# Patient Record
Sex: Female | Born: 1990
Health system: Southern US, Community
[De-identification: ages and names within clinical notes are randomized; demographics above are authoritative.]

## PROBLEM LIST (undated history)

## (undated) HISTORY — PX: KNEE SURGERY: SHX244

---
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
## 2013-11-23 ENCOUNTER — Ambulatory Visit: Payer: BC Managed Care – PPO | Attending: Orthopaedic Surgery | Admitting: Physical Therapy

## 2013-11-23 DIAGNOSIS — M25669 Stiffness of unspecified knee, not elsewhere classified: Secondary | ICD-10-CM | POA: Insufficient documentation

## 2013-11-23 DIAGNOSIS — J45909 Unspecified asthma, uncomplicated: Secondary | ICD-10-CM | POA: Insufficient documentation

## 2013-11-23 DIAGNOSIS — M25569 Pain in unspecified knee: Secondary | ICD-10-CM | POA: Insufficient documentation

## 2013-11-23 DIAGNOSIS — R5381 Other malaise: Secondary | ICD-10-CM | POA: Insufficient documentation

## 2013-11-23 DIAGNOSIS — IMO0001 Reserved for inherently not codable concepts without codable children: Secondary | ICD-10-CM | POA: Insufficient documentation

## 2013-11-25 ENCOUNTER — Ambulatory Visit: Payer: BC Managed Care – PPO | Admitting: Physical Therapy

## 2013-11-27 ENCOUNTER — Ambulatory Visit: Payer: BC Managed Care – PPO | Admitting: Physical Therapy

## 2013-11-30 ENCOUNTER — Ambulatory Visit: Payer: BC Managed Care – PPO | Admitting: Physical Therapy

## 2013-12-02 ENCOUNTER — Ambulatory Visit: Payer: BC Managed Care – PPO | Admitting: Physical Therapy

## 2013-12-07 ENCOUNTER — Encounter: Payer: PRIVATE HEALTH INSURANCE | Admitting: Physical Therapy

## 2013-12-09 ENCOUNTER — Ambulatory Visit: Payer: BC Managed Care – PPO | Admitting: Physical Therapy

## 2013-12-15 ENCOUNTER — Ambulatory Visit: Payer: BC Managed Care – PPO | Attending: Orthopaedic Surgery | Admitting: *Deleted

## 2013-12-15 DIAGNOSIS — R5381 Other malaise: Secondary | ICD-10-CM | POA: Insufficient documentation

## 2013-12-15 DIAGNOSIS — M25669 Stiffness of unspecified knee, not elsewhere classified: Secondary | ICD-10-CM | POA: Insufficient documentation

## 2013-12-15 DIAGNOSIS — IMO0001 Reserved for inherently not codable concepts without codable children: Secondary | ICD-10-CM | POA: Insufficient documentation

## 2013-12-15 DIAGNOSIS — M25569 Pain in unspecified knee: Secondary | ICD-10-CM | POA: Insufficient documentation

## 2013-12-16 ENCOUNTER — Encounter: Payer: PRIVATE HEALTH INSURANCE | Admitting: Physical Therapy

## 2013-12-17 ENCOUNTER — Ambulatory Visit: Payer: BC Managed Care – PPO | Admitting: Physical Therapy

## 2013-12-22 ENCOUNTER — Encounter: Payer: PRIVATE HEALTH INSURANCE | Admitting: *Deleted

## 2014-08-25 ENCOUNTER — Telehealth: Payer: Self-pay

## 2014-10-21 NOTE — Telephone Encounter (Signed)
A user error has taken place: encounter opened in error, closed for administrative reasons.

## 2016-11-16 ENCOUNTER — Ambulatory Visit (INDEPENDENT_AMBULATORY_CARE_PROVIDER_SITE_OTHER): Payer: BLUE CROSS/BLUE SHIELD | Admitting: Physician Assistant

## 2016-11-16 ENCOUNTER — Encounter: Payer: Self-pay | Admitting: Physician Assistant

## 2016-11-16 ENCOUNTER — Encounter (INDEPENDENT_AMBULATORY_CARE_PROVIDER_SITE_OTHER): Payer: Self-pay

## 2016-11-16 VITALS — BP 122/69 | HR 73 | Temp 97.0°F | Ht 70.0 in | Wt 167.6 lb

## 2016-11-16 DIAGNOSIS — J209 Acute bronchitis, unspecified: Secondary | ICD-10-CM | POA: Diagnosis not present

## 2016-11-16 MED ORDER — AZITHROMYCIN 250 MG PO TABS: ORAL_TABLET | ORAL | 0 refills | Status: DC

## 2016-11-16 NOTE — Patient Instructions (Signed)

## 2016-11-16 NOTE — Progress Notes (Signed)
BP 122/69   Pulse 73   Temp 97 F (36.1 C) (Oral)   Ht 5\' 10"  (1.778 m)   Wt 167 lb 9.6 oz (76 kg)   BMI 24.05 kg/m    Subjective:    Patient ID: Mikayla Sandoval, female    DOB: 04/23/1991, 25 y.o.   MRN: [id number]  HPI: Mikayla Sandoval is a 25 y.o. female presenting on 11/16/2016 for Cough  He comes in with a greater than 2 week history of cough and congestion. She has had a history of asthma and bronchitis in the past. She has not had to use her inhaler. She states that she has significant sinus pressure and some postnasal drainage. Her cough is particularly bad at night. The mucus is green when she coughs it up. She does not have any blood at this time. She denies any severe nausea vomiting or diarrhea. No fever or chills.  Relevant past medical, surgical, family and social history reviewed and updated as indicated. Allergies and medications reviewed and updated.  History reviewed. No pertinent past medical history.  Past Surgical History:  Procedure Laterality Date  . KNEE SURGERY Left    ACL    Review of Systems  Constitutional: Positive for fatigue. Negative for activity change, appetite change, chills and fever.  HENT: Positive for congestion, postnasal drip, sinus pressure and sore throat.   Eyes: Negative.   Respiratory: Positive for cough and wheezing.   Cardiovascular: Negative.  Negative for chest pain, palpitations and leg swelling.  Gastrointestinal: Negative.   Genitourinary: Negative.   Musculoskeletal: Negative.   Skin: Negative.   Neurological: Positive for headaches.      Medication List       Accurate as of 11/16/16 10:09 AM. Always use your most recent med list.          azithromycin 250 MG tablet Commonly known as:  ZITHROMAX Z-PAK As directed   TRINESSA (28) 0.18/0.215/0.25 MG-35 MCG tablet Generic drug:  Norgestimate-Ethinyl Estradiol Triphasic          Objective:    BP 122/69   Pulse 73   Temp 97 F (36.1 C) (Oral)   Ht 5\' 10"   (1.778 m)   Wt 167 lb 9.6 oz (76 kg)   BMI 24.05 kg/m   No Known Allergies  Physical Exam  Constitutional: She is oriented to person, place, and time. She appears well-developed and well-nourished.  HENT:  Head: Normocephalic and atraumatic.  Right Ear: There is drainage and tenderness.  Left Ear: There is drainage and tenderness.  Nose: Mucosal edema and rhinorrhea present. Right sinus exhibits maxillary sinus tenderness and frontal sinus tenderness. Left sinus exhibits maxillary sinus tenderness and frontal sinus tenderness.  Mouth/Throat: Oropharyngeal exudate and posterior oropharyngeal erythema present.  Eyes: Conjunctivae and EOM are normal. Pupils are equal, round, and reactive to light.  Neck: Normal range of motion. Neck supple.  Cardiovascular: Normal rate, regular rhythm, normal heart sounds and intact distal pulses.   Pulmonary/Chest: Effort normal. She has wheezes in the right upper field and the left upper field.  Abdominal: Soft. Bowel sounds are normal.  Neurological: She is alert and oriented to person, place, and time. She has normal reflexes.  Skin: Skin is warm and dry. No rash noted.  Psychiatric: She has a normal mood and affect. Her behavior is normal. Judgment and thought content normal.        Assessment & Plan:   1. Acute bronchitis, unspecified organism - azithromycin (ZITHROMAX Z-PAK) 250 MG  tablet; As directed  Dispense: 6 tablet; Refill: 0  Continue all other maintenance medications as listed above.  Follow up plan: Return if symptoms worsen or fail to improve.  Educational handout given for bronchitis  Remus LofflerAngel S. Terressa Evola PA-C [facility] [address]  [place], [place] [id number] [phone]   11/16/2016, 10:09 AM

## 2017-04-24 ENCOUNTER — Other Ambulatory Visit: Payer: Self-pay | Admitting: Physician Assistant

## 2017-04-24 MED ORDER — MONTELUKAST SODIUM 10 MG PO TABS
10.0000 mg | ORAL_TABLET | Freq: Every day | ORAL | 11 refills | Status: DC
Start: 2017-04-24 — End: 2020-04-05

## 2017-10-23 ENCOUNTER — Telehealth: Payer: Self-pay | Admitting: *Deleted

## 2017-10-23 MED ORDER — ACYCLOVIR 800 MG PO TABS: 800.0000 mg | ORAL_TABLET | Freq: Four times a day (QID) | ORAL | 11 refills | Status: AC

## 2017-10-23 NOTE — Addendum Note (Signed)
Addended by: Remus LofflerJONES, Ilario Dhaliwal S on: 10/23/2017 04:44 PM   Modules accepted: Orders

## 2017-10-23 NOTE — Telephone Encounter (Signed)
Fax RF request Med not on current list Acyclovir 800 mg tab #40 [facility]

## 2018-03-11 ENCOUNTER — Other Ambulatory Visit: Payer: Self-pay | Admitting: Physician Assistant

## 2018-03-12 ENCOUNTER — Other Ambulatory Visit: Payer: Self-pay | Admitting: Physician Assistant

## 2019-01-02 ENCOUNTER — Other Ambulatory Visit: Payer: Self-pay | Admitting: Physician Assistant

## 2019-01-14 ENCOUNTER — Encounter: Payer: Self-pay | Admitting: Physician Assistant

## 2019-01-14 ENCOUNTER — Ambulatory Visit: Payer: Self-pay | Admitting: Physician Assistant

## 2019-01-14 VITALS — BP 120/78 | HR 74 | Temp 98.3°F | Ht 70.0 in | Wt 161.5 lb

## 2019-01-14 DIAGNOSIS — J111 Influenza due to unidentified influenza virus with other respiratory manifestations: Secondary | ICD-10-CM

## 2019-01-14 MED ORDER — OSELTAMIVIR PHOSPHATE 75 MG PO CAPS
75.0000 mg | ORAL_CAPSULE | Freq: Two times a day (BID) | ORAL | 0 refills | Status: DC
Start: 2019-01-14 — End: 2019-03-06

## 2019-01-14 NOTE — Progress Notes (Signed)
BP 120/78   Pulse 74   Temp 98.3 F (36.8 C) (Oral)   Ht 5\' 10"  (1.778 m)   Wt 161 lb 8 oz (73.3 kg)   BMI 23.17 kg/m    Subjective:    Patient ID: Mikayla Sandoval, female    DOB: 03-Jul-1991, 28 y.o.   MRN: [id number]  HPI: Mikayla Sandoval is a 28 y.o. female presenting on 01/14/2019 for Sore Throat (pt here today c/o congestion, sinus pressure and sore throat)  This patient has had less than 2 days severe fever, chills, myalgias.  Complains of sinus headache and postnasal drainage. There is copious drainage at times. Associated sore throat, decreased appetite and headache.  Has been exposed to influenza.  Was exposed through friends over the weekend  No past medical history on file. Relevant past medical, surgical, family and social history reviewed and updated as indicated. Interim medical history since our last visit reviewed. Allergies and medications reviewed and updated. DATA REVIEWED: CHART IN EPIC  Family History reviewed for pertinent findings.  Review of Systems  Constitutional: Positive for appetite change, chills, fatigue and fever. Negative for activity change.  HENT: Positive for congestion, postnasal drip and sore throat.   Eyes: Negative.   Respiratory: Negative for cough and wheezing.   Cardiovascular: Negative.  Negative for chest pain, palpitations and leg swelling.  Gastrointestinal: Negative.   Genitourinary: Negative.   Musculoskeletal: Positive for myalgias.  Skin: Negative.   Neurological: Positive for headaches.    Allergies as of 01/14/2019   No Known Allergies     Medication List       Accurate as of January 14, 2019  6:32 PM. Always use your most recent med list.        acyclovir 800 MG tablet Commonly known as:  ZOVIRAX Take 1 tablet (800 mg total) 4 (four) times daily by mouth.   montelukast 10 MG tablet Commonly known as:  SINGULAIR Take 1 tablet (10 mg total) by mouth at bedtime.   oseltamivir 75 MG capsule Commonly known as:   TAMIFLU Take 1 capsule (75 mg total) by mouth 2 (two) times daily.   TRINESSA (28) 0.18/0.215/0.25 MG-35 MCG tablet Generic drug:  Norgestimate-Ethinyl Estradiol Triphasic          Objective:    BP 120/78   Pulse 74   Temp 98.3 F (36.8 C) (Oral)   Ht 5\' 10"  (1.778 m)   Wt 161 lb 8 oz (73.3 kg)   BMI 23.17 kg/m   No Known Allergies  Wt Readings from Last 3 Encounters:  01/14/19 161 lb 8 oz (73.3 kg)  11/16/16 167 lb 9.6 oz (76 kg)    Physical Exam Vitals signs and nursing note reviewed.  Constitutional:      General: She is not in acute distress.    Appearance: She is well-developed.  HENT:     Head: Normocephalic and atraumatic.     Right Ear: Tympanic membrane normal.     Left Ear: Tympanic membrane normal.     Nose: Mucosal edema and rhinorrhea present.     Right Sinus: No frontal sinus tenderness.     Left Sinus: No frontal sinus tenderness.     Mouth/Throat:     Pharynx: Posterior oropharyngeal erythema present. No oropharyngeal exudate.     Tonsils: No tonsillar abscesses.  Eyes:     Conjunctiva/sclera: Conjunctivae normal.     Pupils: Pupils are equal, round, and reactive to light.  Neck:     Musculoskeletal:  Normal range of motion.  Cardiovascular:     Rate and Rhythm: Normal rate and regular rhythm.     Pulses: Normal pulses.     Heart sounds: Normal heart sounds.  Pulmonary:     Effort: Pulmonary effort is normal. No respiratory distress.     Breath sounds: Normal breath sounds.  Abdominal:     General: Bowel sounds are normal.     Palpations: Abdomen is soft.  Skin:    General: Skin is warm and dry.     Findings: No rash.  Neurological:     Mental Status: She is alert and oriented to person, place, and time.     Deep Tendon Reflexes: Reflexes are normal and symmetric.  Psychiatric:        Behavior: Behavior normal.        Thought Content: Thought content normal.        Judgment: Judgment normal.     No results found for this or any  previous visit.    Assessment & Plan:   1. Influenza - oseltamivir (TAMIFLU) 75 MG capsule; Take 1 capsule (75 mg total) by mouth 2 (two) times daily.  Dispense: 10 capsule; Refill: 0   Continue all other maintenance medications as listed above.  Follow up plan: Return if symptoms worsen or fail to improve.  Educational handout given for survey  Remus Loffler PA-C [facility] [address]  [place], [place] [address] [phone]   01/14/2019, 6:32 PM

## 2019-01-14 NOTE — Patient Instructions (Signed)

## 2019-03-06 ENCOUNTER — Other Ambulatory Visit: Payer: Self-pay

## 2019-03-06 ENCOUNTER — Ambulatory Visit (INDEPENDENT_AMBULATORY_CARE_PROVIDER_SITE_OTHER): Payer: PRIVATE HEALTH INSURANCE | Admitting: Family Medicine

## 2019-03-06 DIAGNOSIS — R6889 Other general symptoms and signs: Secondary | ICD-10-CM | POA: Diagnosis not present

## 2019-03-06 DIAGNOSIS — R110 Nausea: Secondary | ICD-10-CM | POA: Diagnosis not present

## 2019-03-06 MED ORDER — ONDANSETRON 4 MG PO TBDP: 4.0000 mg | ORAL_TABLET | Freq: Three times a day (TID) | ORAL | 0 refills | Status: DC | PRN

## 2019-03-06 NOTE — Patient Instructions (Signed)
You have flu like symptoms, we cannot rule out COVID19 infection at this time.  I recommend that you isolate at home.     Person Under Monitoring Name: Mikayla Sandoval  Location: [address] [place] [address]   Infection Prevention Recommendations for Individuals Confirmed to have, or Being Evaluated for, 2019 Novel Coronavirus (COVID-19) Infection Who Receive Care at Home  Individuals who are confirmed to have, or are being evaluated for, COVID-19 should follow the prevention steps below until a healthcare provider or local or state health department says they can return to normal activities.  Stay home except to get medical care You should restrict activities outside your home, except for getting medical care. Do not go to work, school, or public areas, and do not use public transportation or taxis.  Call ahead before visiting your doctor Before your medical appointment, call the healthcare provider and tell them that you have, or are being evaluated for, COVID-19 infection. This will help the healthcare provider's office take steps to keep other people from getting infected. Ask your healthcare provider to call the local or state health department.  Monitor your symptoms Seek prompt medical attention if your illness is worsening (e.g., difficulty breathing). Before going to your medical appointment, call the healthcare provider and tell them that you have, or are being evaluated for, COVID-19 infection. Ask your healthcare provider to call the local or state health department.  Wear a facemask You should wear a facemask that covers your nose and mouth when you are in the same room with other people and when you visit a healthcare provider. People who live with or visit you should also wear a facemask while they are in the same room with you.  Separate yourself from other people in your home As much as possible, you should stay in a different room from other people in your  home. Also, you should use a separate bathroom, if available.  Avoid sharing household items You should not share dishes, drinking glasses, cups, eating utensils, towels, bedding, or other items with other people in your home. After using these items, you should wash them thoroughly with soap and water.  Cover your coughs and sneezes Cover your mouth and nose with a tissue when you cough or sneeze, or you can cough or sneeze into your sleeve. Throw used tissues in a lined trash can, and immediately wash your hands with soap and water for at least 20 seconds or use an alcohol-based hand rub.  Wash your [employer] your hands often and thoroughly with soap and water for at least 20 seconds. You can use an alcohol-based hand sanitizer if soap and water are not available and if your hands are not visibly dirty. Avoid touching your eyes, nose, and mouth with unwashed hands.   Prevention Steps for Caregivers and Household Members of Individuals Confirmed to have, or Being Evaluated for, COVID-19 Infection Being Cared for in the Home  If you live with, or provide care at home for, a person confirmed to have, or being evaluated for, COVID-19 infection please follow these guidelines to prevent infection:  Follow healthcare provider's instructions Make sure that you understand and can help the patient follow any healthcare provider instructions for all care.  Provide for the patient's basic needs You should help the patient with basic needs in the home and provide support for getting groceries, prescriptions, and other personal needs.  Monitor the patient's symptoms If they are getting sicker, call his or her  medical provider and tell them that the patient has, or is being evaluated for, COVID-19 infection. This will help the healthcare provider's office take steps to keep other people from getting infected. Ask the healthcare provider to call the local or state health department.  Limit the  number of people who have contact with the patient  If possible, have only one caregiver for the patient.  Other household members should stay in another home or place of residence. If this is not possible, they should stay  in another room, or be separated from the patient as much as possible. Use a separate bathroom, if available.  Restrict visitors who do not have an essential need to be in the home.  Keep older adults, very young children, and other sick people away from the patient Keep older adults, very young children, and those who have compromised immune systems or chronic health conditions away from the patient. This includes people with chronic heart, lung, or kidney conditions, diabetes, and cancer.  Ensure good ventilation Make sure that shared spaces in the home have good air flow, such as from an air conditioner or an opened window, weather permitting.  Wash your hands often  Wash your hands often and thoroughly with soap and water for at least 20 seconds. You can use an alcohol based hand sanitizer if soap and water are not available and if your hands are not visibly dirty.  Avoid touching your eyes, nose, and mouth with unwashed hands.  Use disposable paper towels to dry your hands. If not available, use dedicated cloth towels and replace them when they become wet.  Wear a facemask and gloves  Wear a disposable facemask at all times in the room and gloves when you touch or have contact with the patient's blood, body fluids, and/or secretions or excretions, such as sweat, saliva, sputum, nasal mucus, vomit, urine, or feces.  Ensure the mask fits over your nose and mouth tightly, and do not touch it during use.  Throw out disposable facemasks and gloves after using them. Do not reuse.  Wash your hands immediately after removing your facemask and gloves.  If your personal clothing becomes contaminated, carefully remove clothing and launder. Wash your hands after  handling contaminated clothing.  Place all used disposable facemasks, gloves, and other waste in a lined container before disposing them with other household waste.  Remove gloves and wash your hands immediately after handling these items.  Do not share dishes, glasses, or other household items with the patient  Avoid sharing household items. You should not share dishes, drinking glasses, cups, eating utensils, towels, bedding, or other items with a patient who is confirmed to have, or being evaluated for, COVID-19 infection.  After the person uses these items, you should wash them thoroughly with soap and water.  Wash laundry thoroughly  Immediately remove and wash clothes or bedding that have blood, body fluids, and/or secretions or excretions, such as sweat, saliva, sputum, nasal mucus, vomit, urine, or feces, on them.  Wear gloves when handling laundry from the patient.  Read and follow directions on labels of laundry or clothing items and detergent. In general, wash and dry with the warmest temperatures recommended on the label.  Clean all areas the individual has used often  Clean all touchable surfaces, such as counters, tabletops, doorknobs, bathroom fixtures, toilets, phones, keyboards, tablets, and bedside tables, every day. Also, clean any surfaces that may have blood, body fluids, and/or secretions or excretions on them.  Wear  gloves when cleaning surfaces the patient has come in contact with.  Use a diluted bleach solution (e.g., dilute bleach with 1 part bleach and 10 parts water) or a household disinfectant with a label that says EPA-registered for coronaviruses. To make a bleach solution at home, add 1 tablespoon of bleach to 1 quart (4 cups) of water. For a larger supply, add  cup of bleach to 1 gallon (16 cups) of water.  Read labels of cleaning products and follow recommendations provided on product labels. Labels contain instructions for safe and effective use of the  cleaning product including precautions you should take when applying the product, such as wearing gloves or eye protection and making sure you have good ventilation during use of the product.  Remove gloves and wash hands immediately after cleaning.  Monitor yourself for signs and symptoms of illness Caregivers and household members are considered close contacts, should monitor their health, and will be asked to limit movement outside of the home to the extent possible. Follow the monitoring steps for close contacts listed on the symptom monitoring form.   ? If you have additional questions, contact your local health department or call the epidemiologist on call at [phone] (available 24/7). ? This guidance is subject to change. For the most up-to-date guidance from [facility], please refer to their website: [email or web address]

## 2019-03-06 NOTE — Progress Notes (Signed)
Telephone visit  Subjective: CC:flu like symptoms PCP: Remus Loffler, PA-C XBM:[id number] Mikayla Sandoval is a 28 y.o. female calls for telephone consult today. Patient provides verbal consent for consult held via phone.  Location of patient: mom's house Location of provider: [facility] Others present for call: none  1. Flu like symptoms She reports onset of headache and associated facial pain Tuesday.  On Wednesday symptoms seem to progressed to chills, low-grade fever, myalgia.  She denies any nasal symptoms, cough, shortness of breath or wheeze.  She reports fatigue.  She had one episode of diarrhea.  No vomiting.  She is able to keep fluids down but notes that she has significantly decreased appetite and some mild nausea.  Last menstrual cycle was about 4 weeks ago.  She is due any day now.  Reports compliance with OCPs.  She is using Tylenol cold and flu as well as NyQuil a.m. and p.m.   ROS: Per HPI  No Known Allergies No past medical history on file.  Current Outpatient Medications:  .  acyclovir (ZOVIRAX) 800 MG tablet, Take 1 tablet (800 mg total) 4 (four) times daily by mouth., Disp: 40 tablet, Rfl: 11 .  montelukast (SINGULAIR) 10 MG tablet, Take 1 tablet (10 mg total) by mouth at bedtime., Disp: 30 tablet, Rfl: 11 .  oseltamivir (TAMIFLU) 75 MG capsule, Take 1 capsule (75 mg total) by mouth 2 (two) times daily., Disp: 10 capsule, Rfl: 0 .  TRINESSA, 28, 0.18/0.215/0.25 MG-35 MCG tablet, , Disp: , Rfl:   Assessment/ Plan: 28 y.o. female   1. Flu-like symptoms We discussed that her symptoms are concerning for possible COVID-19 infection.  We cannot rule this out without a swab but unfortunately these are not available to [place] at this time.  I recommended that she isolate, perform appropriate hygiene and dispensing practices.  She can continue over-the-counter cold and flu medications.  I have added Zofran for her to have on hand for nausea.  Push oral fluids.  Monitor for worsening signs and  symptoms.  We discussed reasons for emergent evaluation the emergency department.  She voiced good understanding.  I will send a note to her my chart for her employer.  We discussed no return to work until she has been at minimum 3 days without symptoms unmedicated.  She voiced good understanding. - ondansetron (ZOFRAN ODT) 4 MG disintegrating tablet; Take 1 tablet (4 mg total) by mouth every 8 (eight) hours as needed for nausea or vomiting.  Dispense: 20 tablet; Refill: 0  2. Nausea - ondansetron (ZOFRAN ODT) 4 MG disintegrating tablet; Take 1 tablet (4 mg total) by mouth every 8 (eight) hours as needed for nausea or vomiting.  Dispense: 20 tablet; Refill: 0   Start time: 10:35am End time: 10:49am  No orders of the defined types were placed in this encounter.   Raliegh Ip, DO [facility] [phone]

## 2019-03-19 ENCOUNTER — Telehealth: Payer: Self-pay | Admitting: Physician Assistant

## 2019-03-19 NOTE — Telephone Encounter (Signed)
It should be fine to return to work as long as you are not running a fever. Good hand hygiene.

## 2019-03-19 NOTE — Telephone Encounter (Signed)
Pt aware.

## 2020-04-05 ENCOUNTER — Ambulatory Visit (INDEPENDENT_AMBULATORY_CARE_PROVIDER_SITE_OTHER): Payer: PRIVATE HEALTH INSURANCE | Admitting: Nurse Practitioner

## 2020-04-05 ENCOUNTER — Telehealth: Payer: Self-pay | Admitting: Physician Assistant

## 2020-04-05 ENCOUNTER — Encounter: Payer: Self-pay | Admitting: Nurse Practitioner

## 2020-04-05 DIAGNOSIS — H6521 Chronic serous otitis media, right ear: Secondary | ICD-10-CM

## 2020-04-05 MED ORDER — MONTELUKAST SODIUM 10 MG PO TABS: 10.0000 mg | ORAL_TABLET | Freq: Every day | ORAL | 0 refills | Status: DC

## 2020-04-05 MED ORDER — FLUTICASONE PROPIONATE 50 MCG/ACT NA SUSP: 2.0000 | Freq: Every day | NASAL | 6 refills | Status: DC

## 2020-04-05 NOTE — Progress Notes (Signed)
   Virtual Visit via telephone Note Due to COVID-19 pandemic this visit was conducted virtually. This visit type was conducted due to national recommendations for restrictions regarding the COVID-19 Pandemic (e.g. social distancing, sheltering in place) in an effort to limit this patient's exposure and mitigate transmission in our community. All issues noted in this document were discussed and addressed.  A physical exam was not performed with this format.  I connected with Mikayla Sandoval on 04/05/20 at 12:20 by telephone and verified that I am speaking with the correct person using two identifiers. Mikayla Sandoval is currently located at work and no one is currently with her during visit. The provider, Mary-Margaret Daphine Deutscher, FNP is located in their office at time of visit.  I discussed the limitations, risks, security and privacy concerns of performing an evaluation and management service by telephone and the availability of in person appointments. I also discussed with the patient that there may be a patient responsible charge related to this service. The patient expressed understanding and agreed to proceed.   History and Present Illness:   Chief Complaint: Otalgia   HPI Right ear feels stopped up. Feels like she has fluid behind her ear drum. She denies any pain or drainage   Review of Systems  Constitutional: Negative.   HENT: Positive for ear pain (mainly stopped up).   Skin: Negative.   All other systems reviewed and are negative.    Observations/Objective: Alert and oriented- answers all questions appropriately No distress    Assessment and Plan: Mikayla Sandoval in today with chief complaint of Otalgia   1. Right chronic serous otitis media Force fluids Blow nose frequently Meds ordered this encounter  Medications  . fluticasone (FLONASE) 50 MCG/ACT nasal spray    Sig: Place 2 sprays into both nostrils daily.    Dispense:  16 g    Refill:  6    Order Specific Question:    Supervising Provider    Answer:   Arville Care A [id number]    Directions on use of nasal spray given   Follow Up Instructions: prn    I discussed the assessment and treatment plan with the patient. The patient was provided an opportunity to ask questions and all were answered. The patient agreed with the plan and demonstrated an understanding of the instructions.   The patient was advised to call back or seek an in-person evaluation if the symptoms worsen or if the condition fails to improve as anticipated.  The above assessment and management plan was discussed with the patient. The patient verbalized understanding of and has agreed to the management plan. Patient is aware to call the clinic if symptoms persist or worsen. Patient is aware when to return to the clinic for a follow-up visit. Patient educated on when it is appropriate to go to the emergency department.   Time call ended:  12:30 I provided 10 minutes of non-face-to-face time during this encounter.    Mary-Margaret Daphine Deutscher, FNP

## 2020-04-05 NOTE — Telephone Encounter (Signed)
  Prescription Request  04/05/2020  What is the name of the medication or equipment? SINGULAIR  Have you contacted your pharmacy to request a refill? (if applicable) NO  Which pharmacy would you like this sent to? [facility]    Patient notified that their request is being sent to the clinical staff for review and that they should receive a response within 2 business days.

## 2020-04-05 NOTE — Telephone Encounter (Signed)
30 day supply sent.  Patient will need to be seen in office for any further refills.  Left message to call back to schedule an appt

## 2020-05-06 ENCOUNTER — Other Ambulatory Visit: Payer: Self-pay | Admitting: Family

## 2021-05-12 DIAGNOSIS — Z01419 Encounter for gynecological examination (general) (routine) without abnormal findings: Secondary | ICD-10-CM | POA: Diagnosis not present

## 2021-08-28 ENCOUNTER — Ambulatory Visit (INDEPENDENT_AMBULATORY_CARE_PROVIDER_SITE_OTHER): Payer: BC Managed Care – PPO | Admitting: Nurse Practitioner

## 2021-08-28 DIAGNOSIS — J0110 Acute frontal sinusitis, unspecified: Secondary | ICD-10-CM | POA: Diagnosis not present

## 2021-08-28 MED ORDER — AZITHROMYCIN 250 MG PO TABS: ORAL_TABLET | ORAL | 0 refills | Status: AC

## 2021-08-28 NOTE — Progress Notes (Signed)
   Virtual Visit  Note Due to COVID-19 pandemic this visit was conducted virtually. This visit type was conducted due to national recommendations for restrictions regarding the COVID-19 Pandemic (e.g. social distancing, sheltering in place) in an effort to limit this patient's exposure and mitigate transmission in our community. All issues noted in this document were discussed and addressed.  A physical exam was not performed with this format.  I connected with Mikayla Sandoval on 08/29/21 at 6:00 pm by telephone and verified that I am speaking with the correct person using two identifiers. Mikayla Sandoval is currently located at home during visit. The provider, Daryll Drown, NP is located in their office at time of visit.  I discussed the limitations, risks, security and privacy concerns of performing an evaluation and management service by telephone and the availability of in person appointments. I also discussed with the patient that there may be a patient responsible charge related to this service. The patient expressed understanding and agreed to proceed.   History and Present Illness:  Sinusitis This is a new problem. The current episode started in the past 7 days. The problem has been gradually worsening since onset. There has been no fever. Associated symptoms include congestion and headaches. Pertinent negatives include no chills. Past treatments include nothing.     Review of Systems  Constitutional:  Negative for chills and fever.  HENT:  Positive for congestion and sinus pain.   Respiratory: Negative.    Cardiovascular: Negative.   Skin:  Negative for rash.  Neurological:  Positive for headaches.  All other systems reviewed and are negative.   Observations/Objective: Tele-visit patient is not in distress  Assessment and Plan: Take meds as prescribed - Use a cool mist humidifier  -Use saline nose sprays frequently -Force fluids -For fever or aches or pains- take Tylenol or  ibuprofen. -Azithromycin 250 mg by mouth for 6 days. -If symptoms do not improve, she may need to be COVID tested to rule this out   Follow Up Instructions: Follow up with unresolved symptoms    I discussed the assessment and treatment plan with the patient. The patient was provided an opportunity to ask questions and all were answered. The patient agreed with the plan and demonstrated an understanding of the instructions.   The patient was advised to call back or seek an in-person evaluation if the symptoms worsen or if the condition fails to improve as anticipated.  The above assessment and management plan was discussed with the patient. The patient verbalized understanding of and has agreed to the management plan. Patient is aware to call the clinic if symptoms persist or worsen. Patient is aware when to return to the clinic for a follow-up visit. Patient educated on when it is appropriate to go to the emergency department.   Time call ended:  6: 10 pm   I provided 10 minutes of  non face-to-face time during this encounter.    Daryll Drown, NP

## 2021-08-29 ENCOUNTER — Encounter: Payer: Self-pay | Admitting: Nurse Practitioner

## 2021-08-29 DIAGNOSIS — Z20822 Contact with and (suspected) exposure to covid-19: Secondary | ICD-10-CM | POA: Diagnosis not present

## 2021-08-29 NOTE — Assessment & Plan Note (Signed)
Take meds as prescribed - Use a cool mist humidifier  -Use saline nose sprays frequently -Force fluids -For fever or aches or pains- take Tylenol or ibuprofen. -Azithromycin 250 mg by mouth for 6 days. -If symptoms do not improve, she may need to be COVID tested to rule this out

## 2022-01-03 ENCOUNTER — Ambulatory Visit (INDEPENDENT_AMBULATORY_CARE_PROVIDER_SITE_OTHER): Payer: No Typology Code available for payment source | Admitting: Family Medicine

## 2022-01-03 DIAGNOSIS — J301 Allergic rhinitis due to pollen: Secondary | ICD-10-CM

## 2022-01-03 DIAGNOSIS — J069 Acute upper respiratory infection, unspecified: Secondary | ICD-10-CM

## 2022-01-03 MED ORDER — BENZONATATE 100 MG PO CAPS: 100.0000 mg | ORAL_CAPSULE | Freq: Three times a day (TID) | ORAL | 0 refills | Status: AC | PRN

## 2022-01-03 MED ORDER — FLUTICASONE PROPIONATE 50 MCG/ACT NA SUSP: 2.0000 | Freq: Every day | NASAL | 6 refills | Status: AC

## 2022-01-03 MED ORDER — MONTELUKAST SODIUM 10 MG PO TABS: 10.0000 mg | ORAL_TABLET | Freq: Every day | ORAL | 3 refills | Status: AC

## 2022-01-03 NOTE — Progress Notes (Signed)
Telephone visit  Subjective: CC: URI PCP: Daryll Drown, NP SWN:[id number] Mikayla Sandoval is a 31 y.o. female calls for telephone consult today. Patient provides verbal consent for consult held via phone.  Due to COVID-19 pandemic this visit was conducted virtually. This visit type was conducted due to national recommendations for restrictions regarding the COVID-19 Pandemic (e.g. social distancing, sheltering in place) in an effort to limit this patient's exposure and mitigate transmission in our community. All issues noted in this document were discussed and addressed.  A physical exam was not performed with this format.   Location of patient: home Location of provider: [facility] Others present for call: none  1. URI Patient reports onset of stuffy nose, mild sore throat, fever (99.87F), chills yesterday am at work.  She reports fatigue, headache today. No diarrhea, nausea or vomiting.  She reports mild low back pain that has worn off since yesterday.  She has been using cold and flu medication which has helped minimally.  No known sick contacts.  No flu or COVID shots.   ROS: Per HPI  No Known Allergies No past medical history on file.  Current Outpatient Medications:    acyclovir (ZOVIRAX) 800 MG tablet, Take 1 tablet (800 mg total) 4 (four) times daily by mouth., Disp: 40 tablet, Rfl: 11   fluticasone (FLONASE) 50 MCG/ACT nasal spray, Place 2 sprays into both nostrils daily., Disp: 16 g, Rfl: 6   montelukast (SINGULAIR) 10 MG tablet, TAKE ONE TABLET AT BEDTIME, Disp: 30 tablet, Rfl: 0   ondansetron (ZOFRAN ODT) 4 MG disintegrating tablet, Take 1 tablet (4 mg total) by mouth every 8 (eight) hours as needed for nausea or vomiting., Disp: 20 tablet, Rfl: 0   TRINESSA, 28, 0.18/0.215/0.25 MG-35 MCG tablet, , Disp: , Rfl:   Assessment/ Plan: 31 y.o. female   URI with cough and congestion - Plan: fluticasone (FLONASE) 50 MCG/ACT nasal spray, benzonatate (TESSALON PERLES) 100 MG capsule  Seasonal  allergic rhinitis due to pollen - Plan: montelukast (SINGULAIR) 10 MG tablet  Suspect viral mediated URI.  I did offer flu and COVID testing but [place]  She is going to see if she can get some testing done at the [facility] and she will let me know if either positive and we can treat accordingly.  If she can get a strep test it may be to her benefit to have that done as well; though I have much less suspicion for strep given other symptoms.  Proceed with symptomatic treatment.  Flonase renewed, Singulair renewed Tessalon Perles sent.  Home care instructions reviewed and reasons for reevaluation discussed.  AVS with some tips to manage viral URI and work note provided  Start time: 12:13pm End time: 12:23pm  Total time spent on patient care (including telephone call/ virtual visit): 10 minutes  Dechelle Attaway Hulen Skains, DO [facility] [phone]

## 2022-01-03 NOTE — Patient Instructions (Signed)
